# Patient Record
Sex: Male | Born: 1975 | Race: White | Hispanic: No | Marital: Married | Smoking: Former smoker
Health system: Southern US, Community
[De-identification: ages and names within clinical notes are randomized; demographics above are authoritative.]

## PROBLEM LIST (undated history)

## (undated) DIAGNOSIS — I10 Essential (primary) hypertension: Secondary | ICD-10-CM

---

## 2005-06-08 ENCOUNTER — Emergency Department (HOSPITAL_COMMUNITY): Admission: EM | Admit: 2005-06-08 | Discharge: 2005-06-08 | Payer: Self-pay | Admitting: Emergency Medicine

## 2006-06-13 ENCOUNTER — Emergency Department (HOSPITAL_COMMUNITY): Admission: EM | Admit: 2006-06-13 | Discharge: 2006-06-13 | Payer: Self-pay | Admitting: Emergency Medicine

## 2013-07-03 ENCOUNTER — Other Ambulatory Visit: Payer: Self-pay

## 2013-07-03 ENCOUNTER — Encounter (HOSPITAL_COMMUNITY): Payer: Self-pay | Admitting: Emergency Medicine

## 2013-07-03 ENCOUNTER — Emergency Department (HOSPITAL_COMMUNITY)
Admission: EM | Admit: 2013-07-03 | Discharge: 2013-07-03 | Disposition: A | Discharge status: Home / Self Care | Payer: Medicaid Other | Attending: Emergency Medicine | Admitting: Emergency Medicine

## 2013-07-03 DIAGNOSIS — IMO0001 Reserved for inherently not codable concepts without codable children: Secondary | ICD-10-CM | POA: Insufficient documentation

## 2013-07-03 DIAGNOSIS — J069 Acute upper respiratory infection, unspecified: Secondary | ICD-10-CM | POA: Insufficient documentation

## 2013-07-03 DIAGNOSIS — Z87891 Personal history of nicotine dependence: Secondary | ICD-10-CM | POA: Insufficient documentation

## 2013-07-03 MED ORDER — HYDROCOD POLST-CHLORPHEN POLST 10-8 MG/5ML PO LQCR: 5.0000 mL | Freq: Once | ORAL | Status: DC

## 2013-07-03 MED ORDER — IBUPROFEN 800 MG PO TABS
800.0000 mg | ORAL_TABLET | Freq: Once | ORAL | Status: AC
  Administered 2013-07-03: 800 mg via ORAL
  Filled 2013-07-03: qty 1

## 2013-07-03 NOTE — ED Notes (Signed)
Pt co flu like symptoms x 4 days, cough, congestion, fever and generalized aches and pains, denies n/v/d.

## 2013-07-03 NOTE — Discharge Instructions (Signed)
Upper Respiratory Infection, Adult An upper respiratory infection (URI) is also known as the common cold. It is often caused by a type of germ (virus). Colds are easily spread (contagious). You can pass it to others by kissing, coughing, sneezing, or drinking out of the same glass. Usually, you get better in 1 or 2 weeks.  HOME CARE   Only take medicine as told by your doctor.  Use a warm mist humidifier or breathe in steam from a hot shower.  Drink enough water and fluids to keep your pee (urine) clear or pale yellow.  Get plenty of rest.  Return to work when your temperature is back to normal or as told by your doctor. You may use a face mask and wash your hands to stop your cold from spreading. GET HELP RIGHT AWAY IF:   After the first few days, you feel you are getting worse.  You have questions about your medicine.  You have chills, shortness of breath, or brown or red spit (mucus).  You have yellow or brown snot (nasal discharge) or pain in the face, especially when you bend forward.  You have a fever, puffy (swollen) neck, pain when you swallow, or white spots in the back of your throat.  You have a bad headache, ear pain, sinus pain, or chest pain.  You have a high-pitched whistling sound when you breathe in and out (wheezing).  You have a lasting cough or cough up blood.  You have sore muscles or a stiff neck. MAKE SURE YOU:   Understand these instructions.  Will watch your condition.  Will get help right away if you are not doing well or get worse. Document Released: 09/30/2007 Document Revised: 07/06/2011 Document Reviewed: 08/18/2010 [facility] Patient Information 2014 [employer], [place].   Rest   Increase oral fluids Use cough medicine as prescribed Return if shortness of breath, difficulty breathing or worsening of symptoms

## 2013-07-03 NOTE — ED Provider Notes (Signed)
CSN: [id number]     Arrival date & time 07/03/13  [id number] History   First MD Initiated Contact with Patient 07/03/13 1022     Chief Complaint  Patient presents with  . Cough     (Consider location/radiation/quality/duration/timing/severity/associated sxs/prior Treatment) Patient is a 38 y.o. male presenting with cough. No language interpreter was used.  Cough Cough characteristics:  Unable to specify Severity:  Mild Context: not sick contacts   Associated symptoms: fever, myalgias and sinus congestion   Associated symptoms: no chest pain, no chills and no ear fullness    Pt is a 38 year old male who presents with a 4 day history of cough, congestion and body aches. He reports that he has had one episode of diarrhea but no nausea or vomiting. He reports that his cough is worse at night. He denies any shortness of breath, difficulty breathing or back pain. He reports generalized body aches, no headache or stiff neck. No recent travel or known sick contacts.   History reviewed. No pertinent past medical history. History reviewed. No pertinent past surgical history. History reviewed. No pertinent family history. History  Substance Use Topics  . Smoking status: Former Games developermoker  . Smokeless tobacco: Not on file  . Alcohol Use: Yes    Review of Systems  Constitutional: Positive for fever. Negative for chills.  Respiratory: Positive for cough.   Cardiovascular: Negative for chest pain.  Musculoskeletal: Positive for myalgias.      Allergies  Review of patient's allergies indicates no known allergies.  Home Medications   Current Outpatient Rx  Name  Route  Sig  Dispense  Refill  . OVER THE COUNTER MEDICATION   Oral   Take 30 mLs by mouth every 6 (six) hours as needed (cold/flu symptoms). Tylenol cold and flu liquid medication         . chlorpheniramine-HYDROcodone (TUSSIONEX PENNKINETIC ER) 10-8 MG/5ML LQCR   Oral   Take 5 mLs by mouth once.   115 mL   0    BP 157/97   Pulse 81  Temp(Src) 99.1 F (37.3 C) (Oral)  Resp 16  Ht 5\' 9"  (1.753 m)  Wt 180 lb (81.647 kg)  BMI 26.57 kg/m2  SpO2 98% Physical Exam  Nursing note and vitals reviewed. Constitutional: He is oriented to person, place, and time. He appears well-developed and well-nourished. No distress.  HENT:  Head: Normocephalic and atraumatic.  Right Ear: External ear normal.  Left Ear: External ear normal.  Mild post oropharyngeal erythema  Eyes: Conjunctivae and EOM are normal.  Neck: Neck supple. No JVD present. No tracheal deviation present. No thyromegaly present.  Cardiovascular: Normal rate, regular rhythm, normal heart sounds and intact distal pulses.   Pulmonary/Chest: Effort normal and breath sounds normal. No respiratory distress. He has no wheezes.  Abdominal: Soft. Bowel sounds are normal. He exhibits no distension. There is no tenderness.  Musculoskeletal: Normal range of motion.  Lymphadenopathy:    He has no cervical adenopathy.  Neurological: He is alert and oriented to person, place, and time. No cranial nerve deficit. Coordination normal.  Skin: Skin is warm and dry.  Psychiatric: He has a normal mood and affect. His behavior is normal. Judgment and thought content normal.    ED Course  Procedures (including critical care time) Labs Review Labs Reviewed - No data to display Imaging Review No results found.   EKG Interpretation None      MDM   Final diagnoses:  URI (upper respiratory infection)  Cough, congestion, fever and body aches. No tachycardia or tachypnea. SPO2 98%. Discussed plan of care for rest and hydration. Tussionex prescription given for cough. Return precautions given.     Irish Elders, NP 07/05/13 [phone]

## 2013-07-06 NOTE — ED Provider Notes (Signed)
Medical screening examination/treatment/procedure(s) were performed by non-physician practitioner and as supervising physician I was immediately available for consultation/collaboration.   EKG Interpretation None        Patrycja Mumpower L Necia Kamm, MD 07/06/13 1534 

## 2019-06-02 ENCOUNTER — Other Ambulatory Visit: Payer: Self-pay

## 2019-06-02 ENCOUNTER — Encounter (HOSPITAL_COMMUNITY): Payer: Self-pay

## 2019-06-02 ENCOUNTER — Emergency Department (HOSPITAL_COMMUNITY)
Admission: EM | Admit: 2019-06-02 | Discharge: 2019-06-02 | Disposition: A | Discharge status: Home / Self Care | Payer: Medicaid Other | Attending: Emergency Medicine | Admitting: Emergency Medicine

## 2019-06-02 ENCOUNTER — Emergency Department (HOSPITAL_COMMUNITY): Payer: Medicaid Other

## 2019-06-02 DIAGNOSIS — Z7982 Long term (current) use of aspirin: Secondary | ICD-10-CM | POA: Diagnosis not present

## 2019-06-02 DIAGNOSIS — I10 Essential (primary) hypertension: Secondary | ICD-10-CM | POA: Insufficient documentation

## 2019-06-02 DIAGNOSIS — R079 Chest pain, unspecified: Secondary | ICD-10-CM

## 2019-06-02 DIAGNOSIS — Z79899 Other long term (current) drug therapy: Secondary | ICD-10-CM | POA: Insufficient documentation

## 2019-06-02 DIAGNOSIS — R0789 Other chest pain: Secondary | ICD-10-CM | POA: Insufficient documentation

## 2019-06-02 DIAGNOSIS — Z87891 Personal history of nicotine dependence: Secondary | ICD-10-CM | POA: Insufficient documentation

## 2019-06-02 HISTORY — DX: Essential (primary) hypertension: I10

## 2019-06-02 LAB — BASIC METABOLIC PANEL
Anion gap: 10 (ref 5–15)
BUN: 16 mg/dL (ref 6–20)
CO2: 28 mmol/L (ref 22–32)
Calcium: 9.4 mg/dL (ref 8.9–10.3)
Chloride: 103 mmol/L (ref 98–111)
Creatinine, Ser: 1.08 mg/dL (ref 0.61–1.24)
GFR calc Af Amer: >60 mL/min (ref >60)
GFR calc non Af Amer: >60 mL/min (ref >60)
Glucose, Bld: 114 mg/dL — ABNORMAL HIGH (ref 70–99)
Potassium: 3.6 mmol/L (ref 3.5–5.1)
Sodium: 141 mmol/L (ref 135–145)

## 2019-06-02 LAB — CBC
HCT: 47.3 % (ref 39.0–52.0)
Hemoglobin: 15.5 g/dL (ref 13.0–17.0)
MCH: 32.2 pg (ref 26.0–34.0)
MCHC: 32.8 g/dL (ref 30.0–36.0)
MCV: 98.3 fL (ref 80.0–100.0)
Platelets: 304 10*3/uL (ref 150–400)
RBC: 4.81 MIL/uL (ref 4.22–5.81)
RDW: 12.5 % (ref 11.5–15.5)
WBC: 6.5 10*3/uL (ref 4.0–10.5)
nRBC: 0 % (ref 0.0–0.2)

## 2019-06-02 LAB — TROPONIN I (HIGH SENSITIVITY)
Troponin I (High Sensitivity): <2 ng/L (ref <18)
Troponin I (High Sensitivity): <2 ng/L (ref <18)

## 2019-06-02 MED ORDER — SODIUM CHLORIDE 0.9% FLUSH: 3.0000 mL | Freq: Once | INTRAVENOUS | Status: DC

## 2019-06-02 NOTE — ED Notes (Signed)
EKG done and seen by Dr Wentz 

## 2019-06-02 NOTE — ED Triage Notes (Signed)
Pt presents to ED with complaints of left sided chest pain x 1 year on and off. Pt states he has been lightheaded also.

## 2019-06-02 NOTE — ED Provider Notes (Signed)
[facility] Provider Note   CSN: [id number] Arrival date & time: 06/02/19  1747     History Chief Complaint  Patient presents with  . Chest Pain    Roger Hardy is a 44 y.o. male.  Pt reports she has been having chest pain on and off for a year.  Pt reports he had a stress test a year ago after having chest pain.  Pt reports his MD started him on amlodipine.  Pt reports he has high blood pressure.  Pt reports he went to ED in [place] last pm for the same.  Pt decided not to wait to be seen.  Pt reports that the pain goes through to his back.  Pt states he has not had a cough. He denies any reflux.  Pt reports he is not seeing a cardiologist.    The history is provided by the patient. No language interpreter was used.       Past Medical History:  Diagnosis Date  . Hypertension     There are no problems to display for this patient.   History reviewed. No pertinent surgical history.     No family history on file.  Social History   Tobacco Use  . Smoking status: Former Research scientist (life sciences)  . Smokeless tobacco: Never Used  Substance Use Topics  . Alcohol use: Yes  . Drug use: No    Home Medications Prior to Admission medications   Medication Sig Start Date End Date Taking? Authorizing Provider  amLODipine (NORVASC) 5 MG tablet Take 5 mg by mouth daily. 04/24/19  Yes [provider]  aspirin EC 81 MG tablet Take 81 mg by mouth daily.   Yes [provider]  benazepril (LOTENSIN) 40 MG tablet Take 40 mg by mouth daily. 05/03/19  Yes [provider]    Allergies    Patient has no known allergies.  Review of Systems   Review of Systems  All other systems reviewed and are negative.   Physical Exam Updated Vital Signs BP 103/70   Pulse (!) 50   Temp 98.3 F (36.8 C) (Oral)   Resp 12   Ht 5\' 9"  (1.753 m)   Wt 90.7 kg   SpO2 100%   BMI 29.53 kg/m   Physical Exam Vitals and nursing note reviewed.  Constitutional:    Appearance: He is well-developed.  HENT:     Head: Normocephalic and atraumatic.  Eyes:     Conjunctiva/sclera: Conjunctivae normal.  Cardiovascular:     Rate and Rhythm: Normal rate and regular rhythm.     Heart sounds: Normal heart sounds. No murmur.  Pulmonary:     Effort: Pulmonary effort is normal. No respiratory distress.     Breath sounds: Normal breath sounds.  Abdominal:     Palpations: Abdomen is soft.     Tenderness: There is no abdominal tenderness.  Musculoskeletal:     Cervical back: Neck supple.  Skin:    General: Skin is warm and dry.  Neurological:     Mental Status: He is alert.     ED Results / Procedures / Treatments   Labs (all labs ordered are listed, but only abnormal results are displayed) Labs Reviewed  BASIC METABOLIC PANEL - Abnormal; Notable for the following components:      Result Value   Glucose, Bld 114 (*)    All other components within normal limits  CBC  TROPONIN I (HIGH SENSITIVITY)  TROPONIN I (HIGH SENSITIVITY)  EKG EKG Interpretation  Date/Time:  Friday June 02 2019 19:37:05 EST Ventricular Rate:  49 PR Interval:    QRS Duration: 91 QT Interval:  428 QTC Calculation: 387 R Axis:   46 Text Interpretation: Ectopic atrial bradycardia Abnormal R-wave progression, early transition Baseline wander in lead(s) V5 Since last tracing 07/03/2013 rate slower Otherwise no significant change Confirmed by Mancel Bale [phone]) on 06/02/2019 7:56:10 PM   Radiology DG Chest 2 View  Result Date: 06/02/2019 CLINICAL DATA:  Chest pain EXAM: CHEST - 2 VIEW COMPARISON:  06/01/2019 FINDINGS: The heart size and mediastinal contours are within normal limits. Both lungs are clear. The visualized skeletal structures are unremarkable. IMPRESSION: No acute abnormality of the lungs. Electronically Signed   By: Lauralyn Primes M.D.   On: 06/02/2019 19:10    Procedures Procedures (including critical care time)  Medications Ordered in ED Medications   sodium chloride flush (NS) 0.9 % injection 3 mL (has no administration in time range)    ED Course  I have reviewed the triage vital signs and the nursing notes.  Pertinent labs & imaging results that were available during my care of the patient were reviewed by me and considered in my medical decision making (see chart for details).    MDM Rules/Calculators/A&P                      MDM  EKg no acute abnormality, chest xray normal,  No cough or respiratory illness.  Troponin is negative x 2.  Pt is currently pain free.  I counseled pt.  Pt advised to follow up with Cardiology for evaluation.  Pt advised to continue curretn medications and asa.  Pt advised to try mylanta.  Pt advised to schedule to see Cardiology for evaluation  Final Clinical Impression(s) / ED Diagnoses Final diagnoses:  Nonspecific chest pain    Rx / DC Orders ED Discharge Orders    None    An After Visit Summary was printed and given to the patient.   Osie Cheeks 06/02/19 2155    Mancel Bale, MD 06/03/19 [phone]

## 2019-07-21 ENCOUNTER — Encounter (HOSPITAL_COMMUNITY): Payer: Self-pay

## 2019-07-21 ENCOUNTER — Emergency Department (HOSPITAL_COMMUNITY)
Admission: EM | Admit: 2019-07-21 | Discharge: 2019-07-21 | Disposition: A | Discharge status: Home / Self Care | Payer: Medicaid Other | Attending: Emergency Medicine | Admitting: Emergency Medicine

## 2019-07-21 ENCOUNTER — Other Ambulatory Visit: Payer: Self-pay

## 2019-07-21 ENCOUNTER — Emergency Department (HOSPITAL_COMMUNITY): Payer: Medicaid Other

## 2019-07-21 DIAGNOSIS — I10 Essential (primary) hypertension: Secondary | ICD-10-CM | POA: Diagnosis not present

## 2019-07-21 DIAGNOSIS — Z79899 Other long term (current) drug therapy: Secondary | ICD-10-CM | POA: Diagnosis not present

## 2019-07-21 DIAGNOSIS — Z87891 Personal history of nicotine dependence: Secondary | ICD-10-CM | POA: Insufficient documentation

## 2019-07-21 DIAGNOSIS — Z7982 Long term (current) use of aspirin: Secondary | ICD-10-CM | POA: Diagnosis not present

## 2019-07-21 DIAGNOSIS — U071 COVID-19: Secondary | ICD-10-CM | POA: Insufficient documentation

## 2019-07-21 DIAGNOSIS — M791 Myalgia, unspecified site: Secondary | ICD-10-CM | POA: Diagnosis present

## 2019-07-21 LAB — CBC WITH DIFFERENTIAL/PLATELET
Abs Immature Granulocytes: 0.01 10*3/uL (ref 0.00–0.07)
Basophils Absolute: 0 10*3/uL (ref 0.0–0.1)
Basophils Relative: 1 %
Eosinophils Absolute: 0 10*3/uL (ref 0.0–0.5)
Eosinophils Relative: 0 %
HCT: 46.5 % (ref 39.0–52.0)
Hemoglobin: 15.8 g/dL (ref 13.0–17.0)
Immature Granulocytes: 0 %
Lymphocytes Relative: 29 %
Lymphs Abs: 1 10*3/uL (ref 0.7–4.0)
MCH: 32.8 pg (ref 26.0–34.0)
MCHC: 34 g/dL (ref 30.0–36.0)
MCV: 96.5 fL (ref 80.0–100.0)
Monocytes Absolute: 0.5 10*3/uL (ref 0.1–1.0)
Monocytes Relative: 14 %
Neutro Abs: 1.9 10*3/uL (ref 1.7–7.7)
Neutrophils Relative %: 56 %
Platelets: 207 10*3/uL (ref 150–400)
RBC: 4.82 MIL/uL (ref 4.22–5.81)
RDW: 12.4 % (ref 11.5–15.5)
WBC: 3.4 10*3/uL — ABNORMAL LOW (ref 4.0–10.5)
nRBC: 0 % (ref 0.0–0.2)

## 2019-07-21 LAB — BASIC METABOLIC PANEL
Anion gap: 10 (ref 5–15)
BUN: 16 mg/dL (ref 6–20)
CO2: 28 mmol/L (ref 22–32)
Calcium: 9.3 mg/dL (ref 8.9–10.3)
Chloride: 101 mmol/L (ref 98–111)
Creatinine, Ser: 1.09 mg/dL (ref 0.61–1.24)
GFR calc Af Amer: >60 mL/min (ref >60)
GFR calc non Af Amer: >60 mL/min (ref >60)
Glucose, Bld: 102 mg/dL — ABNORMAL HIGH (ref 70–99)
Potassium: 3.6 mmol/L (ref 3.5–5.1)
Sodium: 139 mmol/L (ref 135–145)

## 2019-07-21 MED ORDER — KETOROLAC TROMETHAMINE 30 MG/ML IJ SOLN
30.0000 mg | Freq: Once | INTRAMUSCULAR | Status: AC
  Administered 2019-07-21: 30 mg via INTRAVENOUS
  Filled 2019-07-21: qty 1

## 2019-07-21 MED ORDER — SODIUM CHLORIDE 0.9 % IV BOLUS
1000.0000 mL | Freq: Once | INTRAVENOUS | Status: AC
  Administered 2019-07-21: 1000 mL via INTRAVENOUS

## 2019-07-21 NOTE — Discharge Instructions (Signed)
As we discussed, your work-up was reassuring today.  You can take Tylenol or Ibuprofen as directed for pain. You can alternate Tylenol and Ibuprofen every 4 hours. If you take Tylenol at 1pm, then you can take Ibuprofen at 5pm. Then you can take Tylenol again at 9pm.   You should not exceed 4000 g of Tylenol a day.  Make sure to stay hydrated and get plenty of rest.  Return emergency department for any difficulty breathing, vomiting or any other worsening or concerning symptoms.

## 2019-07-21 NOTE — ED Provider Notes (Signed)
[facility] EMERGENCY DEPARTMENT Provider Note   CSN: [id number] Arrival date & time: 07/21/19  1958     History Chief Complaint  Patient presents with  . Generalized Body Aches    covid + x 5 days    Roger Hardy is a 43 y.o. male past medical story of hypertension who presents for evaluation of generalized body aches, headache, cough, fatigue, generalized weakness that is been ongoing for last 5 days.  Patient reports that 5 days ago, he tested positive for Covid.  He reports that he had had some symptoms a few days prior to that.  Patient states that he has just felt "rough" over the last few days.  He states he has not had any energy and has been tired and has had myalgias.  He reports achiness to his chest, back, abdomen as well as headache.  He states that he has been taking 500 mg of Tylenol intermittently for symptoms with no improvement.  He does not think he has been measuring a fever.  He has had a cough that is productive.  Does not have any shortness of breath.  He does report some chest soreness that is associated only with cough.  He is still able to eat and drink denies any nausea/vomiting.  He is a former smoker and denies any history of asthma or COPD.  The history is provided by the patient.       Past Medical History:  Diagnosis Date  . Hypertension     There are no problems to display for this patient.   History reviewed. No pertinent surgical history.     No family history on file.  Social History   Tobacco Use  . Smoking status: Former Smoker    Types: Cigarettes    Quit date: 07/20/2017    Years since quitting: 2.0  . Smokeless tobacco: Never Used  Substance Use Topics  . Alcohol use: Yes  . Drug use: No    Home Medications Prior to Admission medications   Medication Sig Start Date End Date Taking? Authorizing Provider  amLODipine (NORVASC) 5 MG tablet Take 5 mg by mouth daily. 04/24/19   [provider]  aspirin EC 81 MG tablet  Take 81 mg by mouth daily.    [provider]  benazepril (LOTENSIN) 40 MG tablet Take 40 mg by mouth daily. 05/03/19   [provider]    Allergies    Patient has no known allergies.  Review of Systems   Review of Systems  Constitutional: Positive for fatigue. Negative for fever.  Respiratory: Positive for cough. Negative for shortness of breath.   Cardiovascular: Negative for chest pain.  Gastrointestinal: Negative for abdominal pain, nausea and vomiting.  Genitourinary: Negative for dysuria and hematuria.  Musculoskeletal: Positive for myalgias.  Neurological: Positive for weakness (generalized) and headaches.  All other systems reviewed and are negative.   Physical Exam Updated Vital Signs BP (!) 122/93   Pulse 68   Temp 98.3 F (36.8 C) (Oral)   Resp 18   Ht 5\' 9"  (1.753 m)   Wt 97.5 kg   SpO2 97%   BMI 31.75 kg/m   Physical Exam Vitals and nursing note reviewed.  Constitutional:      Appearance: Normal appearance. He is well-developed.  HENT:     Head: Normocephalic and atraumatic.  Eyes:     General: Lids are normal.     Conjunctiva/sclera: Conjunctivae normal.     Pupils: Pupils are equal,  round, and reactive to light.  Cardiovascular:     Rate and Rhythm: Normal rate and regular rhythm.     Pulses: Normal pulses.     Heart sounds: Normal heart sounds. No murmur. No friction rub. No gallop.   Pulmonary:     Effort: Pulmonary effort is normal.     Breath sounds: Normal breath sounds.     Comments: Lungs clear to auscultation bilaterally.  Symmetric chest rise.  No wheezing, rales, rhonchi. Abdominal:     Palpations: Abdomen is soft. Abdomen is not rigid.     Tenderness: There is no abdominal tenderness. There is no guarding.     Comments: Abdomen is soft, non-distended, non-tender. No rigidity, No guarding. No peritoneal signs.  Musculoskeletal:        General: Normal range of motion.     Cervical back: Full passive range of motion  without pain.  Skin:    General: Skin is warm and dry.     Capillary Refill: Capillary refill takes less than 2 seconds.  Neurological:     Mental Status: He is alert and oriented to person, place, and time.     Comments: Normal gait  Normal strength  MAE Follows commands.   Psychiatric:        Speech: Speech normal.     ED Results / Procedures / Treatments   Labs (all labs ordered are listed, but only abnormal results are displayed) Labs Reviewed  BASIC METABOLIC PANEL - Abnormal; Notable for the following components:      Result Value   Glucose, Bld 102 (*)    All other components within normal limits  CBC WITH DIFFERENTIAL/PLATELET - Abnormal; Notable for the following components:   WBC 3.4 (*)    All other components within normal limits    EKG None  Radiology DG Chest Portable 1 View  Result Date: 07/21/2019 CLINICAL DATA:  44 year old male positive for COVID-19.  Cough. EXAM: PORTABLE CHEST 1 VIEW COMPARISON:  Chest radiographs 06/02/2019. FINDINGS: Portable AP upright view at 2042 hours. Normal lung volumes and mediastinal contours. Allowing for portable technique the lungs are clear. No pneumothorax or pleural effusion. Negative visible bowel gas pattern and osseous structures. IMPRESSION: Negative portable chest. Electronically Signed   By: Odessa Fleming M.D.   On: 07/21/2019 20:53    Procedures Procedures (including critical care time)  Medications Ordered in ED Medications  sodium chloride 0.9 % bolus 1,000 mL (1,000 mLs Intravenous New Bag/Given 07/21/19 2040)  ketorolac (TORADOL) 30 MG/ML injection 30 mg (30 mg Intravenous Given 07/21/19 2040)    ED Course  I have reviewed the triage vital signs and the nursing notes.  Pertinent labs & imaging results that were available during my care of the patient were reviewed by me and considered in my medical decision making (see chart for details).    MDM Rules/Calculators/A&P                      44 year old male  who presents for evaluation of generalized body aches, cough, myalgias, generalized weakness, fatigue has been ongoing for last 5 days.  Recently diagnosed with Covid.  Has been taking Tylenol but only 500 mg.  Initially arrival, he is afebrile, nontoxic-appearing.  Vital signs are stable.  Lung exam benign.  I suspect this is most likely symptoms related to Covid infection.  We will plan to check basic labs, chest x-ray to ensure no acute abnormality.  BMP is unremarkable.  CBC shows  leukopenia of 3.4.  Chest x-ray negative for any acute abnormalities.  Patient able to ambulate while maintaining sats between 98-99%   At this time, patient with no evidence of respiratory distress.  He is hemodynamically stable.  I suspect his symptoms are secondary to COVID-19 infection. At this time, patient exhibits no emergent life-threatening condition that require further evaluation in ED or admission. Patient had ample opportunity for questions and discussion. All patient's questions were answered with full understanding. Strict return precautions discussed. Patient expresses understanding and agreement to plan.  Roger Hardy was evaluated in Emergency Department on 07/21/2019 for the symptoms described in the history of present illness. He was evaluated in the context of the global COVID-19 pandemic, which necessitated consideration that the patient might be at risk for infection with the SARS-CoV-2 virus that causes COVID-19. Institutional protocols and algorithms that pertain to the evaluation of patients at risk for COVID-19 are in a state of rapid change based on information released by regulatory bodies including the CDC and federal and state organizations. These policies and algorithms were followed during the patient's care in the ED.  Portions of this note were generated with Scientist, clinical (histocompatibility and immunogenetics). Dictation errors may occur despite best attempts at proofreading.  Final Clinical Impression(s) / ED  Diagnoses Final diagnoses:  COVID-19    Rx / DC Orders ED Discharge Orders    None       Rosana Hoes 07/21/19 2216    Donnetta Hutching, MD 07/21/19 2229

## 2019-07-21 NOTE — ED Notes (Signed)
ED Provider at bedside. 

## 2019-07-21 NOTE — ED Notes (Signed)
Ambulated Pt in room on Pulse Ox. Pt maintained 98% O2 ambulating on pulse Ox. Provider notified.

## 2019-07-21 NOTE — ED Triage Notes (Signed)
Pt reports generalized body aches and headache. Reports he tested positive for Covid 5 days ago at [facility]. Reports his whole family has it, but he "has it worse". States he last had tylenol 500mg  at 1500.

## 2021-07-13 IMAGING — DX DG CHEST 1V PORT
1 series · 1 of 1 positions shown · non-contrast
Comparison: Chest radiographs 06/02/2019.

CLINICAL DATA: 44-year-old male positive for [id number].  Cough.

EXAM:
PORTABLE CHEST 1 VIEW

[chest ap]
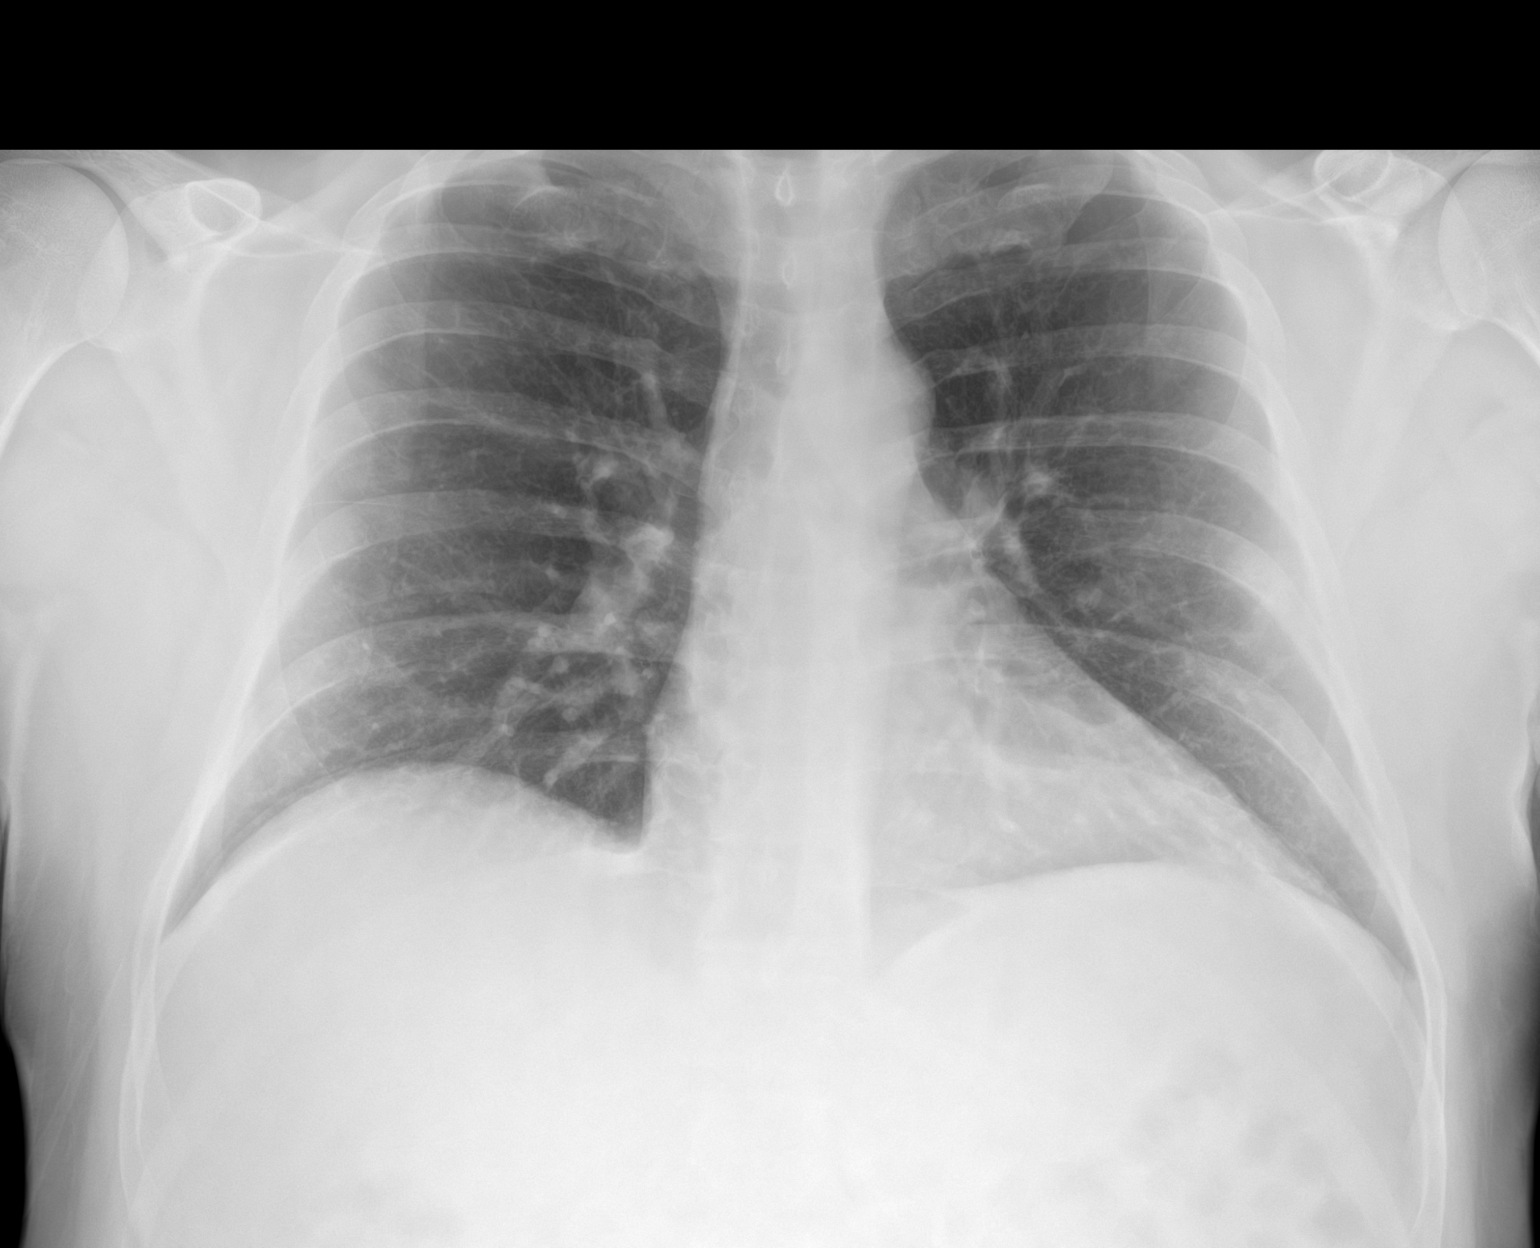

[1 of 1 positions shown; findings below may reference images not displayed]

FINDINGS: Portable AP upright view at 1871 hours. Normal lung volumes and
mediastinal contours. Allowing for portable technique the lungs are
clear. No pneumothorax or pleural effusion. Negative visible bowel
gas pattern and osseous structures.
IMPRESSION: Negative portable chest.
